# Patient Record
Sex: Female | Born: 1975 | Race: Black or African American | Hispanic: No | Marital: Married | Smoking: Never smoker
Health system: Southern US, Community
[De-identification: ages and names within clinical notes are randomized; demographics above are authoritative.]

## PROBLEM LIST (undated history)

## (undated) HISTORY — PX: BREAST EXCISIONAL BIOPSY: SUR124

---

## 2000-08-24 ENCOUNTER — Other Ambulatory Visit: Admission: RE | Admit: 2000-08-24 | Discharge: 2000-08-24 | Payer: Self-pay | Admitting: *Deleted

## 2001-08-26 ENCOUNTER — Other Ambulatory Visit: Admission: RE | Admit: 2001-08-26 | Discharge: 2001-08-26 | Payer: Self-pay | Admitting: Obstetrics and Gynecology

## 2002-08-28 ENCOUNTER — Other Ambulatory Visit: Admission: RE | Admit: 2002-08-28 | Discharge: 2002-08-28 | Payer: Self-pay | Admitting: Obstetrics and Gynecology

## 2003-09-01 ENCOUNTER — Other Ambulatory Visit: Admission: RE | Admit: 2003-09-01 | Discharge: 2003-09-01 | Payer: Self-pay | Admitting: Obstetrics and Gynecology

## 2004-07-27 ENCOUNTER — Inpatient Hospital Stay (HOSPITAL_COMMUNITY): Admission: AD | Admit: 2004-07-27 | Discharge: 2004-07-30 | Payer: Self-pay | Admitting: Obstetrics and Gynecology

## 2004-07-31 ENCOUNTER — Encounter: Admission: RE | Admit: 2004-07-31 | Discharge: 2004-08-30 | Payer: Self-pay | Admitting: Obstetrics and Gynecology

## 2004-10-10 ENCOUNTER — Encounter: Admission: RE | Admit: 2004-10-10 | Discharge: 2004-10-10 | Payer: Self-pay | Admitting: Obstetrics and Gynecology

## 2005-04-07 ENCOUNTER — Encounter: Admission: RE | Admit: 2005-04-07 | Discharge: 2005-04-07 | Payer: Self-pay | Admitting: Obstetrics and Gynecology

## 2006-03-27 ENCOUNTER — Encounter: Admission: RE | Admit: 2006-03-27 | Discharge: 2006-03-27 | Payer: Self-pay | Admitting: Obstetrics and Gynecology

## 2006-10-01 ENCOUNTER — Ambulatory Visit: Payer: Self-pay | Admitting: Gastroenterology

## 2006-10-19 ENCOUNTER — Ambulatory Visit: Payer: Self-pay | Admitting: Gastroenterology

## 2006-10-29 ENCOUNTER — Ambulatory Visit: Payer: Self-pay | Admitting: Gastroenterology

## 2006-11-28 ENCOUNTER — Encounter: Admission: RE | Admit: 2006-11-28 | Discharge: 2006-11-28 | Payer: Self-pay | Admitting: Obstetrics and Gynecology

## 2007-10-11 ENCOUNTER — Ambulatory Visit: Payer: Self-pay | Admitting: Cardiology

## 2007-10-22 ENCOUNTER — Ambulatory Visit: Payer: Self-pay

## 2007-10-22 ENCOUNTER — Encounter: Payer: Self-pay | Admitting: Cardiology

## 2007-11-06 ENCOUNTER — Ambulatory Visit: Payer: Self-pay | Admitting: Cardiology

## 2008-07-13 ENCOUNTER — Telehealth: Payer: Self-pay | Admitting: Gastroenterology

## 2008-07-13 ENCOUNTER — Ambulatory Visit: Payer: Self-pay | Admitting: Cardiology

## 2008-07-13 ENCOUNTER — Telehealth: Payer: Self-pay | Admitting: Internal Medicine

## 2008-07-13 ENCOUNTER — Ambulatory Visit: Payer: Self-pay | Admitting: Internal Medicine

## 2008-07-13 ENCOUNTER — Encounter: Payer: Self-pay | Admitting: Physician Assistant

## 2008-07-13 DIAGNOSIS — R002 Palpitations: Secondary | ICD-10-CM | POA: Insufficient documentation

## 2008-07-13 DIAGNOSIS — K625 Hemorrhage of anus and rectum: Secondary | ICD-10-CM | POA: Insufficient documentation

## 2008-07-13 DIAGNOSIS — K589 Irritable bowel syndrome without diarrhea: Secondary | ICD-10-CM | POA: Insufficient documentation

## 2008-07-13 DIAGNOSIS — R1031 Right lower quadrant pain: Secondary | ICD-10-CM | POA: Insufficient documentation

## 2008-07-13 DIAGNOSIS — R1032 Left lower quadrant pain: Secondary | ICD-10-CM | POA: Insufficient documentation

## 2008-07-13 DIAGNOSIS — R110 Nausea: Secondary | ICD-10-CM | POA: Insufficient documentation

## 2008-07-13 DIAGNOSIS — R109 Unspecified abdominal pain: Secondary | ICD-10-CM | POA: Insufficient documentation

## 2008-07-15 ENCOUNTER — Telehealth: Payer: Self-pay | Admitting: Gastroenterology

## 2008-07-27 ENCOUNTER — Ambulatory Visit: Payer: Self-pay | Admitting: Gastroenterology

## 2008-07-31 ENCOUNTER — Ambulatory Visit: Payer: Self-pay | Admitting: Gastroenterology

## 2008-07-31 ENCOUNTER — Encounter: Payer: Self-pay | Admitting: Gastroenterology

## 2008-08-04 ENCOUNTER — Encounter: Payer: Self-pay | Admitting: Gastroenterology

## 2008-08-04 ENCOUNTER — Telehealth: Payer: Self-pay | Admitting: Gastroenterology

## 2008-08-17 ENCOUNTER — Ambulatory Visit: Payer: Self-pay | Admitting: Gastroenterology

## 2010-04-05 ENCOUNTER — Ambulatory Visit: Payer: Self-pay | Admitting: Gastroenterology

## 2010-04-05 ENCOUNTER — Telehealth: Payer: Self-pay | Admitting: Gastroenterology

## 2010-04-05 DIAGNOSIS — R1013 Epigastric pain: Secondary | ICD-10-CM

## 2010-04-05 DIAGNOSIS — K3189 Other diseases of stomach and duodenum: Secondary | ICD-10-CM | POA: Insufficient documentation

## 2010-05-09 ENCOUNTER — Encounter: Payer: Self-pay | Admitting: Gastroenterology

## 2010-05-09 ENCOUNTER — Telehealth: Payer: Self-pay | Admitting: Gastroenterology

## 2010-05-10 ENCOUNTER — Telehealth: Payer: Self-pay | Admitting: Gastroenterology

## 2010-06-22 ENCOUNTER — Encounter (INDEPENDENT_AMBULATORY_CARE_PROVIDER_SITE_OTHER): Payer: Self-pay | Admitting: *Deleted

## 2010-06-29 ENCOUNTER — Ambulatory Visit
Admission: RE | Admit: 2010-06-29 | Discharge: 2010-06-29 | Payer: Self-pay | Source: Home / Self Care | Attending: Gastroenterology | Admitting: Gastroenterology

## 2010-07-08 ENCOUNTER — Encounter: Payer: Self-pay | Admitting: Gastroenterology

## 2010-07-08 ENCOUNTER — Ambulatory Visit
Admission: RE | Admit: 2010-07-08 | Discharge: 2010-07-08 | Payer: Self-pay | Source: Home / Self Care | Attending: Gastroenterology | Admitting: Gastroenterology

## 2010-07-10 ENCOUNTER — Encounter: Payer: Self-pay | Admitting: Internal Medicine

## 2010-07-10 ENCOUNTER — Encounter: Payer: Self-pay | Admitting: Obstetrics and Gynecology

## 2010-07-12 ENCOUNTER — Telehealth: Payer: Self-pay | Admitting: Gastroenterology

## 2010-07-19 NOTE — Assessment & Plan Note (Signed)
Summary: indigestion/sheri    History of Present Illness Visit Type: Follow-up Visit Primary GI MD: Melvia Heaps MD [facility] Primary Provider: Rudi Heap, MD Requesting Provider: n/a Chief Complaint: Patient c/o worsening GERD symptoms which has become more noticable in the last 2 weeks. Patient also has some nausea but no vomiting. She has some abdominal bloating as well as periumiblical abdominal pain. History of Present Illness:   Tara [name] returned complaining of nausea, abdominal distention and excess gas.  She has had some pyrosis.  Symptoms have occurred over the past 2 weeks.  She feels bloated and has a mild periumbilical upper abdominal discomfort.  She has had similar episodes of in the past but of  much shorter duration.  There has been no change in medications.  There is no change in her bowel habits.   GI Review of Systems    Reports abdominal pain, acid reflux, belching, bloating, chest pain, heartburn, and  nausea.     Location of  Abdominal pain: periumbilical.    Denies dysphagia with liquids, dysphagia with solids, loss of appetite, vomiting, vomiting blood, weight loss, and  weight gain.        Denies anal fissure, black tarry stools, change in bowel habit, constipation, diarrhea, diverticulosis, fecal incontinence, heme positive stool, hemorrhoids, irritable bowel syndrome, jaundice, light color stool, liver problems, rectal bleeding, and  rectal pain.    Current Medications (verified): 1)  Lutera 0.1-20 Mg-Mcg Tabs (Levonorgestrel-Ethinyl Estrad) .... Take 1 Tablet By Mouth Once A Day 2)  Kids Gummy Bear Vitamins  Chew (Pediatric Multivit-Minerals-C) .... Once Daily  Allergies (verified): 1)  ! * Ivp Dye  Past History:  Past Medical History: Reviewed history from 07/13/2008 and no changes required. Current Problems:  ABDOMINAL PAIN, UPPER (ICD-789.09) Hx of NAUSEA (ICD-787.02) Hx of PALPITATIONS (ICD-785.1)  Past Surgical History: Boipsy on Rt  breast.  Family History: Family History of Heart Disease: Paternal grandparents No FH of Colon Cancer: Family History of Breast Cancer:Aunt  Family History of Ovarian Cancer: Grandmother Family History of Prostate Cancer: Uncle Family History of Colon Polyps: Mother Family History of Diabetes: Grandmother, Mother  Social History: Reviewed history from 08/17/2008 and no changes required. Patient has never smoked.  Alcohol Use - no Occupation: [employer] Daily Caffeine Use -0 Illicit Drug Use - no  Review of Systems       The patient complains of allergy/sinus.  The patient denies anemia, anxiety-new, arthritis/joint pain, back pain, blood in urine, breast changes/lumps, change in vision, confusion, cough, coughing up blood, depression-new, fainting, fatigue, fever, headaches-new, hearing problems, heart murmur, heart rhythm changes, itching, menstrual pain, muscle pains/cramps, night sweats, nosebleeds, pregnancy symptoms, shortness of breath, skin rash, sleeping problems, sore throat, swelling of feet/legs, swollen lymph glands, thirst - excessive , urination - excessive , urination changes/pain, urine leakage, vision changes, and voice change.         All other systems were reviewed and were negative   Vital Signs:  Patient profile:   35 year old female Height:      63 inches Weight:      144 pounds BMI:     25.60 BSA:     1.68 Pulse rate:   88 / minute Pulse rhythm:   regular BP sitting:   114 / 80  (left arm)  Vitals Entered By: Lamona Curl CMA Duncan Dull) (April 05, 2010 1:42 PM)  Physical Exam  Additional Exam:  On physical exam she is a well-developed well-nourished female  skin: anicteric HEENT:  normocephalic; PEERLA; no nasal or pharyngeal abnormalities neck: supple nodes: no cervical lymphadenopathy chest: clear to ausculatation and percussion heart: no murmurs, gallops, or rubs abd: soft, nontender; BS normoactive; no abdominal masses, tenderness,  organomegaly; there is mild abdominal distention rectal: deferred ext: no cynanosis, clubbing, edema skeletal: no deformities neuro: oriented x 3; no focal abnormalities    Impression & Recommendations:  Problem # 1:  DYSPEPSIA (ICD-536.8) Assessment New Symptoms are nonspecific and could be due to ulcer or nonulcer dyspepsia.  Gastroparesis is also a consideration.  Recommendations #1 trial of Nexium 40 mg daily #2 if patient does not improve the next 5-7 days I would consider upper endoscopy and, if not diagnostic, gastric emptying scan  Patient Instructions: 1)  Copy sent to : Rudi Heap, MD 2)  Nexium has bent sent to your pharmacy 3)  The medication list was reviewed and reconciled.  All changed / newly prescribed medications were explained.  A complete medication list was provided to the patient / caregiver. Prescriptions: NEXIUM 40 MG CPDR (ESOMEPRAZOLE MAGNESIUM) take one tab daily  #30 x 1   Entered and Authorized by:   Louis Meckel MD   Signed by:   Louis Meckel MD on 04/05/2010   Method used:   Electronically to        [facility] [facility] (retail)       [address]       [place], [place]  [address]       Ph: [phone]       Fax: [phone]   RxID:   [id number]

## 2010-07-19 NOTE — Progress Notes (Signed)
Summary: triage   Phone Note Call from Patient Call back at [phone]   Caller: Patient Call For: Dr. Arlyce Dice Reason for Call: Talk to Nurse Summary of Call: offered ptnext available for "persistent indigestion"... but pt says she cant wait until November to be seen, she is "sick" Initial call taken by: Vallarie Mare,  April 05, 2010 8:30 AM  Follow-up for Phone Call        patient scheduled for today at 1:30 Follow-up by: Darcey Nora RN, CGRN,  April 05, 2010 9:04 AM

## 2010-07-19 NOTE — Progress Notes (Signed)
Summary: Triage   Phone Note Call from Patient Call back at Home Phone [phone]   Caller: Patient Call For: Dr. Arlyce Dice Reason for Call: Talk to Nurse Summary of Call: Started Nexium 2 wks ago was told to f/u in 1 week. Abd pain last night. Initial call taken by: Karna Christmas,  May 09, 2010 8:13 AM  Follow-up for Phone Call        Patient  had an initial improvement in her GERD symptoms, but last night her abdominal pain returned. Per your office visit 10/18/11you would consider EGD if no improvement.  Please advise next step. Follow-up by: Darcey Nora RN, CGRN,  May 09, 2010 3:50 PM  Additional Follow-up for Phone Call Additional follow up Details #1::        schedule EGD Additional Follow-up by: Louis Meckel MD,  May 09, 2010 4:08 PM    Additional Follow-up for Phone Call Additional follow up Details #2::    Left message for patient to call back Darcey Nora RN, [facility]  May 09, 2010 4:35 PM  Patient  is scheduled for an EGD 07/08/09 4:00, pre-visit  06/24/09 4:00 Follow-up by: Darcey Nora RN, CGRN,  May 09, 2010 5:03 PM

## 2010-07-19 NOTE — Letter (Signed)
Summary: Previsit letter  [facility]  [address] [place], [place] [address]   Phone: [phone]  Fax: [phone]       05/09/2010 MRN: [id number]  Tara [name] [address] [place], [place]  [address]  Dear Ms. [place],  Welcome to the Gastroenterology Division at [facility].    You are scheduled to see a nurse for your pre-procedure visit on 06/24/09  at 4:00 on the 3rd floor at [facility], [address]  We ask that you try to arrive at our office 15 minutes prior to your appointment time to allow for check-in.  Your nurse visit will consist of discussing your medical and surgical history, your immediate family medical history, and your medications.    Please bring a complete list of all your medications or, if you prefer, bring the medication bottles and we will list them.  We will need to be aware of both prescribed and over the counter drugs.  We will need to know exact dosage information as well.  If you are on blood thinners (Coumadin, Plavix, Aggrenox, Ticlid, etc.) please call our office today/prior to your appointment, as we need to consult with your physician about holding your medication.   Please be prepared to read and sign documents such as consent forms, a financial agreement, and acknowledgement forms.  If necessary, and with your consent, a friend or relative is welcome to sit-in on the nurse visit with you.  Please bring your insurance card so that we may make a copy of it.  If your insurance requires a referral to see a specialist, please bring your referral form from your primary care physician.  No co-pay is required for this nurse visit.     If you cannot keep your appointment, please call [phone] to cancel or reschedule prior to your appointment date.  This allows [place] the opportunity to schedule an appointment for another patient in need of care.    Thank you for choosing [facility] for your medical needs.   We appreciate the opportunity to care for you.  Please visit [place] at our website  to learn more about our practice.                     Sincerely.                                                                                                                   The Gastroenterology Division

## 2010-07-19 NOTE — Progress Notes (Signed)
Summary: Nexium ?'s   Phone Note Call from Patient Call back at Home Phone [phone]   Caller: Patient Call For: Dr. Arlyce Dice Reason for Call: Talk to Nurse Summary of Call: Nexium ?'s Initial call taken by: Vallarie Mare,  April 05, 2010 3:45 PM  Follow-up for Phone Call        Returned pts call, she wanted to verify how to take the Nexium 1 hour or 30 minutes before first meal of th day..Explained to her to take 30 minutes before first meal of the day Follow-up by: Merri Ray CMA Duncan Dull),  April 05, 2010 4:29 PM

## 2010-07-19 NOTE — Progress Notes (Signed)
Summary: ?'s re Nexium   Phone Note Call from Patient Call back at Home Phone [phone]   Caller: Patient Call For: Dr. Arlyce Dice Reason for Call: Talk to Nurse Summary of Call: ?'s re Nexium Initial call taken by: Vallarie Mare,  May 10, 2010 1:21 PM  Follow-up for Phone Call        patient asking if she should stay on Nexium.  Advised she needs to take Nexium daily Follow-up by: Darcey Nora RN, CGRN,  May 10, 2010 1:32 PM

## 2010-07-21 NOTE — Miscellaneous (Signed)
Summary: [facility]  Clinical Lists Changes  Allergies: Changed allergy or adverse reaction from * IVP DYE to * IVP DYE

## 2010-07-21 NOTE — Miscellaneous (Signed)
  Clinical Lists Changes  Medications: Removed medication of NEXIUM 40 MG CPDR (ESOMEPRAZOLE MAGNESIUM) take one tab daily Added new medication of HYOMAX-SL 0.125 MG SUBL (HYOSCYAMINE SULFATE) take 2 tabs s.l. q4h as needed abd pain - Signed Rx of HYOMAX-SL 0.125 MG SUBL (HYOSCYAMINE SULFATE) take 2 tabs s.l. q4h as needed abd pain;  #20 x 2;  Signed;  Entered by: Louis Meckel MD;  Authorized by: Louis Meckel MD;  Method used: Electronically to [facility] [address], [place], [place], [place]  [address], Ph: [phone], Fax: [phone]    Prescriptions: HYOMAX-SL 0.125 MG SUBL (HYOSCYAMINE SULFATE) take 2 tabs s.l. q4h as needed abd pain  #20 x 2   Entered and Authorized by:   Louis Meckel MD   Signed by:   Louis Meckel MD on 07/08/2010   Method used:   Electronically to        [facility] [address] [facility] (retail)       [address]       [place], [place]  [address]       Ph: [phone]       Fax: [phone]   RxID:   [id number]

## 2010-07-21 NOTE — Procedures (Addendum)
Summary: Upper Endoscopy  Patient: Tara [name] Note: All result statuses are Final unless otherwise noted.  Tests: (1) Upper Endoscopy (EGD)   EGD Upper Endoscopy       DONE (C)      [facility]     [address]     [place], [place]  [address]           ENDOSCOPY PROCEDURE REPORT           PATIENT:  Tara, [name]  MR#:  [id number]     BIRTHDATE:  1976-01-30, 34 yrs. old  GENDER:  female           ENDOSCOPIST:  Barbette Hair. Arlyce Dice, MD     Referred by:  Rudi Heap, M.D.           PROCEDURE DATE:  07/08/2010     PROCEDURE:  EGD - diagnostic (correction)     ASA CLASS:  Class I     INDICATIONS:  abdominal pain           MEDICATIONS:   Fentanyl 75 mcg IV, Versed 7 mg IV, glycopyrrolate     (Robinal) 0.2 mg IV, 0.6cc simethancone 0.6 cc PO     TOPICAL ANESTHETIC:  Exactacain Spray           DESCRIPTION OF PROCEDURE:   After the risks benefits and     alternatives of the procedure were thoroughly explained, informed     consent was obtained.  The LB GIF-H180 [id number] endoscope was     introduced through the mouth and advanced to the third portion of     the duodenum, without limitations.  The instrument was slowly     withdrawn as the mucosa was fully examined.     <<PROCEDUREIMAGES>>           The upper, middle, and distal third of the esophagus were     carefully inspected and no abnormalities were noted. The z-line     was well seen at the GEJ. The endoscope was pushed into the fundus     which was normal including a retroflexed view. The antrum,gastric     body, first and second part of the duodenum were unremarkable (see     image1, image2, image3, image4, image5, and image7).     Retroflexed views revealed no abnormalities.    The scope was then     withdrawn from the patient and the procedure completed.           COMPLICATIONS:  None           ENDOSCOPIC IMPRESSION:     1) Normal EGD     RECOMMENDATIONS: 1) continue nexium (correction)     2) hyomax  as needed     3) return to office as     needed           REPEAT EXAM:  No           ______________________________     Barbette Hair. Arlyce Dice, MD           CC:           n.     REVISED:  07/11/2010 08:49 AM     eSIGNED:   Barbette Hair. Kaplan at 07/11/2010 08:49 AM           Jorje Guild, [id number]  Note: An exclamation mark (!) indicates a result that was not dispersed into the flowsheet. Document Creation Date: 07/11/2010 8:49 AM _______________________________________________________________________  Marland Kitchen  1) Order result status: Final Collection or observation date-time: 07/08/2010 16:06 Requested date-time:  Receipt date-time:  Reported date-time:  Referring Physician:   Ordering Physician: Melvia Heaps [phone]) Specimen Source:  Source: Launa Grill Order Number: [id number] Lab site:

## 2010-07-21 NOTE — Letter (Signed)
Summary: EGD Instructions  [facility]  [address]   [place], [place] [address]   Phone: [phone]  Fax: [phone]       Tara [name]    [birth year]    MRN: [id number]       Procedure Day [name]: Friday, 07-08-10     Arrival Time: 3:00 p.m.     Procedure Time: 4:00 p.m.     Location of Procedure:                    x  [facility] (4th Floor)  PREPARATION FOR ENDOSCOPY  On 07-08-10  THE DAY OF THE PROCEDURE:  1.   No solid foods, milk or milk products are allowed after midnight the night before your procedure.  2.   Do not drink anything colored red or purple.  Avoid juices with pulp.  No orange juice.  3.  You may drink clear liquids until 2:00 p.m., which is 2 hours before your procedure.                                                                                                CLEAR LIQUIDS INCLUDE: Water Jello Ice Popsicles Tea (sugar ok, no milk/cream) Powdered fruit flavored drinks Coffee (sugar ok, no milk/cream) Gatorade Juice: apple, white grape, white cranberry  Lemonade Clear bullion, consomm, broth Carbonated beverages (any kind) Strained chicken noodle soup Hard Candy   MEDICATION INSTRUCTIONS  Unless otherwise instructed, you should take regular prescription medications with a small sip of water as early as possible the morning of your procedure.               OTHER INSTRUCTIONS  You will need a responsible adult at least 37 years of age to accompany you and drive you home.   This person must remain in the waiting room during your procedure.  Wear loose fitting clothing that is easily removed.  Leave jewelry and other valuables at home.  However, you may wish to bring a book to read or an iPod/MP3 player to listen to music as you wait for your procedure to start.  Remove all body piercing jewelry and leave at home.  Total time from sign-in until discharge is approximately 2-3 hours.  You should go  home directly after your procedure and rest.  You can resume normal activities the day after your procedure.  The day of your procedure you should not:   Drive   Make legal decisions   Operate machinery   Drink alcohol   Return to work  You will receive specific instructions about eating, activities and medications before you leave.    The above instructions have been reviewed and explained to me by   Ezra Sites RN  June 29, 2010 4:15 PM    I fully understand and can verbalize these instructions _____________________________ Date _________

## 2010-07-21 NOTE — Progress Notes (Signed)
Summary: Triage   Phone Note Call from Patient Call back at Home Phone [phone]   Caller: Patient Call For: Dr. Arlyce Dice Reason for Call: Talk to Nurse Summary of Call: Patient is having right sided pain and does not feel really well, she says she feels cold but does not have a fever Initial call taken by: [place] Johnson,  July 12, 2010 11:14 AM  Follow-up for Phone Call        Patient states that she had an egd on Friday and now she is having some pain a couple of inches to the right of her belly button. She states that the area is tender to touch and that it hurts when she walks and when she takes a deep breath. Has some pain in her back also. She states she has an overall aches and pains and has some chills. States she does not have a fever. Dr. Arlyce Dice please advise. Follow-up by: Selinda Michaels RN,  July 12, 2010 1:24 PM  Additional Follow-up for Phone Call Additional follow up Details #1::        Have her take hyomax 2 tabs sublingual every 4 hours as gone.  in 24-48 hours if she is not better   She should call back should call back in 24-48 hours the pain is no  better. Additional Follow-up by: Louis Meckel MD,  July 12, 2010 3:42 PM    Additional Follow-up for Phone Call Additional follow up Details #2::    Patient aware of Dr. Marzetta Board recommendations. She will call us back in 24-48hours if no better. Follow-up by: Selinda Michaels RN,  July 12, 2010 3:48 PM

## 2010-11-01 NOTE — Assessment & Plan Note (Signed)
[facility]                            CARDIOLOGY OFFICE NOTE   BRANDILEE, PIES                  MRN:          [id number]  DATE:10/11/2007                            DOB:          [birth year]    PRIMARY:  Paulita Cradle.   REASON FOR PRESENTATION:  Evaluate the patient with palpitations.   HISTORY OF PRESENT ILLNESS:  The patient is pleasant 35 year old African-  American female with a history of palpitations.  She had these initially  when she was pregnant about 3 years ago.  She saw another cardiologist  in [place].  It was felt to be related to the pregnancy.  She had it  again last year.  She was having rapid rates at rest.  This was a sinus  tachycardia.  She reports having a treadmill, which was normal, an echo  which was normal, and a 30-day event monitor.  She was initially given  some cardiac medications that she did not have filled.  She was told she  had generalized anxiety disorder.  She said things slowly improved and  she did not receive treatment.   However, this year she has continued to have palpitations with more  frequency.  This really started in February.  She describes what was  similar to PVCs documented in the past.  She feels a skipped heartbeat.  She may have several of these in an hour.  They may last for hours at a  time.  Some days are better than others.  She notices them often when  she eats.  She otherwise does not notice any association with exercise.  She has had some increased symptoms recently at night.  She will get  mildly dyspneic at times with these.  She will feel a little  lightheaded.  She does not have any chest discomfort, neck or arm  discomfort.  She does not drink caffeine.  She has not had any frank  syncope.  She not had any symptoms of sustained tachy arrhythmias or  what sounds like bigeminy or other pattern.   She did recently start Lexapro 5 mg daily and thinks this helped with  some symptoms.  She does describe generalized tingling in her hands and  legs.  She feels sometimes like her muscles are twitching.   PAST MEDICAL HISTORY:  Premature ventricular contractions documented,  sinus tachycardia.  She has no history of hypertension, diabetes, or  hyperlipidemia.  Past surgical history of right breast biopsy in 2008.   ALLERGIES:  Seasonal.   MEDICATIONS:  Lexapro 5 mg daily, Lutera birth control pill.   SOCIAL HISTORY:  The patient is a Radio broadcast assistant.  She is  married.  She has one 53-year-old child.  She does not smoke cigarettes  or drink alcohol.   FAMILY HISTORY:  Noncontributory for sudden cardiac death,  cardiomyopathy, syncope, arrhythmia, early coronary artery disease, or  other cardiovascular problems.   REVIEW OF SYSTEMS:  As stated in HPI and positive for occasional  headaches, occasional reflux.  Negative for other systems.   PHYSICAL EXAMINATION:  The patient is in no distress.  Blood pressure 118/83, heart rate 108 and regular, weight 132 pounds,  body mass index 23.  HEENT:  Eyelids unremarkable.  Pupils are equal, round, and reactive to  light and accommodation.  Fundi within normal limits.  Oral mucosa  unremarkable.  NECK:  No jugular venous distension at 45 degrees, carotid upstroke  brisk and symmetric, no bruits, thyromegaly.  LYMPHATICS:  No cervical, axillary, or inguinal adenopathy.  LUNGS:  Clear to auscultation bilaterally.  BACK:  No costovertebral angle tenderness.  CHEST:  Unremarkable.  HEART:  PMI not displaced or sustained, S1 and S2 within normal limits,  no S3, no S4, no clicks, rubs, murmurs.  ABDOMEN:  Flat, positive bowel sounds, normal in frequency and pitch, no  bruits, rebound, guarding.  No midline pulsatile mass, hepatomegaly,  splenomegaly.  SKIN:  No rashes, no nodules.  EXTREMITIES:  With 2+ pulses throughout, no edema, cyanosis, clubbing.  NEURO:  Oriented to person, place, and time, cranial  nerves 2-12 grossly  intact, motor grossly intact.    EKG sinus rhythm, rate 102, axis within normal limits, intervals within  normal limits, no acute ST-T wave changes.   ASSESSMENT/PLAN:  1. Palpitations.  The patient had documented premature ventricular      contractions.  I think she is feeling these.  May be premature      atrial contractions.  She had extensive workup.  Given the      increased frequency, I do want to repeat an echocardiogram in      particular looking for RV dysplasia.  If this is normal, I would      not suspect further cardiovascular testing would be warranted.      Will begin to treat these symptomatically.  I will start with      propranolol 10 mg b.i.d.  If she has any problems taking this      medication, she will let me know.  Otherwise, I will follow her in      a couple of weeks.  2. Generalized anxiety disorder.  The patient was diagnosed with this      in the past.  I do suspect that there is some component of this.      She is somewhat anxious in the office.  She was to have started on      Lexapro and will have this followed by Paulita Cradle.  3. Followup.  I will see her in 2 weeks as described.     Rollene Rotunda, MD, [facility]  Electronically Signed    JH/MedQ  DD: 10/11/2007  DT: 10/11/2007  Job #: [id number]   cc:   Paulita Cradle, NP

## 2010-11-01 NOTE — Assessment & Plan Note (Signed)
[facility]                            CARDIOLOGY OFFICE NOTE   Tara, [name]                  MRN:          [id number]  DATE:11/06/2007                            DOB:          15-Jan-1976    PRIMARY CARE PHYSICIAN:  Tara Cradle, MD.   REASON FOR PRESENTATION:  Evaluate patient with palpitations.   HISTORY OF PRESENT ILLNESS:  The patient is a 35 year old with a history  of palpitations as extensively outlined on the previous note.  At that  time, I gave her a prescription for p.r.n. propranolol.  We also did an  echocardiogram to make sure she had no structural abnormalities.  There  was no evidence of RV dysplasia or other arrhythmias.  Of note, the  patient had just been started on Lexapro at the time of that visit.  She  now has been on that medicine for a few weeks and says that with this,  the palpitations have diminished substantially.  She only had 1 day when  she had these when she was stressed for a church program.  She actually  did not have the propranolol at that time.  She has since gotten the  prescription filled, but she not needed to take any.  There has been no  presyncope or syncope.  She has no chest pain.   ASSESSMENT/PLAN:  Palpitations.  I discussed this today with the  patient.  No further cardiovascular testing is suggested.  She will  continue on Lexapro and will take propranolol as needed.  She will come  back and see me if she has any worsening symptoms going forward.     Rollene Rotunda, MD, [facility]  Electronically Signed    JH/MedQ  DD: 11/06/2007  DT: 11/06/2007  Job #: [id number]

## 2010-11-01 NOTE — Assessment & Plan Note (Signed)
[place] [facility]                         GASTROENTEROLOGY OFFICE NOTE   Tara [name], Tara [name]                  MRN:          [id number]  DATE:10/29/2006                            DOB:          1975-06-29    PROBLEM:  Abdominal pain and nausea.   [name] has returned for scheduled followup.  Since her last visit  almost 1 month ago, she has had no further episodes of pain or nausea.  Abdominal ultrasound was entirely unremarkable.  She was given Nexium,  though she did not fill her prescription.  She remains only on an oral  contraceptive.   PHYSICAL EXAMINATION:  Pulse 68, blood pressure 118/80.  Weight 137.   IMPRESSION:  Very intermittent episodes of upper gastrointestinal  distress characterized by pain, nausea and bloating.  Symptoms are  suggestive of acute cholecystitis, though no gallstones were seen by  ultrasound.  This mitigates against, but does not rule out cholecystitis  as a possibility.  She may have ulcer or non-ulcer dyspepsia.   RECOMMENDATIONS:  1. NuLev 0.25 mg sublingual q.4 h. p.r.n. with the onset of pain.  2. If anticholinergics do not promptly help her symptoms, she was      instructed to contact the office, at which point I would attempt to      get a STAT hepatobiliary scan.     Barbette Hair. Arlyce Dice, MD,FACG  Electronically Signed    RDK/MedQ  DD: 10/29/2006  DT: 10/30/2006  Job #: [id number]   cc:   Ernestina Penna, M.D.

## 2010-11-04 NOTE — Letter (Signed)
October 01, 2006    Yolande Jolly   RE:  BRITTLYN, CLOE  MRN:  [id number]  /  DOB:  12-11-1975   Dear Ms. Juanetta Gosling:   It is my pleasure to have treated you recently as a new patient in my  office.  I appreciate your confidence and the opportunity to participate  in your care.   Since I do have a busy inpatient endoscopy schedule and office schedule,  my office hours vary weekly.  I am, however, available for emergency  calls every day through my office.  If I cannot promptly meet an urgent  office appointment, another one of our gastroenterologists will be able  to assist you.   My well-trained staff are prepared to help you at all times.  For  emergencies after office hours, a physician from our gastroenterology  section is always available through my 24-hour answering service.   While you are under my care, I encourage discussion of your questions  and concerns, and I will be happy to return your calls as soon as I am  available.   Once again, I welcome you as a new patient and I look forward to a happy  and healthy relationship.    Sincerely,      Barbette Hair. Arlyce Dice, MD,FACG  Electronically Signed   RDK/MedQ  DD: 10/01/2006  DT: 10/02/2006  Job #: [id number]

## 2010-11-04 NOTE — Assessment & Plan Note (Signed)
[facility]                         GASTROENTEROLOGY OFFICE NOTE   RIDA, LOUDIN                  MRN:          [id number]  DATE:10/01/2006                            DOB:          [birth year]    PROBLEM:  Abdominal pain.   Ms. Frankenfield is a pleasant 35 year old white female referred through the  courtesy of Dr. Christell Constant and his associates for evaluation.  Over the past  2 years she has been suffering from episodic upper abdominal pain.  This  may occur once every 2-3 months and is characterized by moderately  severe midepigastric discomfort.  Pain may radiate to the right side and  around to the back.  It is often associated with nausea and excess  eructations and pyrosis.  She also complains of abdominal bloating when  this occurs.  In between she feels well except for frequent intermittent  nausea.  She has been on oral contraceptives for over 2 years.  She is  on no gastric irritants, including nonsteroidals.  There has been no  change in bowel habits.  Bowels tend to be somewhat erratic.  There is  no history of melena or hematochezia.   PAST MEDICAL HISTORY:  Unremarkable.   FAMILY HISTORY:  Noncontributory.   Only medications are oral contraceptives.   She has no allergies.   She neither smokes nor drinks.  She is married and works as a Nurse, learning disability.   REVIEW OF SYSTEMS:  Positive for back pain, headaches and fatigue.   PHYSICAL EXAMINATION:  VITAL SIGNS:  Pulse 88, blood pressure 130/88,  weight 132.  HEENT: EOMI. PERRLA. Sclerae are anicteric.  Conjunctivae are pink.  NECK:  Supple without thyromegaly, adenopathy or carotid bruits.  CHEST:  Clear to auscultation and percussion without adventitious  sounds.  CARDIAC:  Regular rhythm; normal S1 S2.  There are no murmurs, gallops  or rubs.  ABDOMEN:  Bowel sounds are normoactive.  Abdomen is soft, non-tender and  non-distended.  There are no abdominal masses,  tenderness, splenic  enlargement or hepatomegaly.  EXTREMITIES:  Full range of motion.  No cyanosis, clubbing or edema.  RECTAL:  Deferred.   IMPRESSION:  Intermittent moderately severe abdominal pain with bloating  and nausea.  Symptoms could be due to acute cholecystitis.  Ulcer or  nonulcer dyspepsia and gastroesophageal reflux disease are other  considerations.  Finally, her oral contraceptive could be causing  nausea.   RECOMMENDATION:  1. A trial of Nexium 40 mg a day.  2. Abdominal ultrasound.  3. Consider upper endoscopy if symptoms continue and ultrasound is      negative.  4. Consider holding her oral contraceptive if nausea continues and no      diagnosis is evident by the above tests.     Barbette Hair. Arlyce Dice, MD,FACG  Electronically Signed    RDK/MedQ  DD: 10/01/2006  DT: 10/02/2006  Job #: [id number]   cc:   Ernestina Penna, M.D.

## 2010-11-04 NOTE — Letter (Signed)
October 01, 2006    Ernestina Penna, M.D.  [address] [place], [place] [address]   RE:  Tara [name], Tara [name]  MRN:  [id number]  /  DOB:  01-Apr-1976   Dear Dr. Christell Constant:   Upon your kind referral, I had the pleasure of evaluating your patient  and I am pleased to offer my findings.  I saw Tara [name] in  the office today.  Enclosed is a copy of my progress note that details  my findings and recommendations.   Thank you for the opportunity to participate in your patient's care.    Sincerely,      Barbette Hair. Arlyce Dice, MD,FACG  Electronically Signed    RDK/MedQ  DD: 10/01/2006  DT: 10/02/2006  Job #: [id number]

## 2010-12-08 ENCOUNTER — Other Ambulatory Visit: Payer: Self-pay | Admitting: Obstetrics and Gynecology

## 2012-02-26 ENCOUNTER — Other Ambulatory Visit: Payer: Self-pay | Admitting: Obstetrics and Gynecology

## 2012-02-26 DIAGNOSIS — N644 Mastodynia: Secondary | ICD-10-CM

## 2012-02-26 DIAGNOSIS — Z9889 Other specified postprocedural states: Secondary | ICD-10-CM

## 2012-02-28 ENCOUNTER — Ambulatory Visit
Admission: RE | Admit: 2012-02-28 | Discharge: 2012-02-28 | Disposition: A | Discharge status: Home / Self Care | Payer: BC Managed Care – PPO | Source: Ambulatory Visit | Attending: Obstetrics and Gynecology | Admitting: Obstetrics and Gynecology

## 2012-02-28 DIAGNOSIS — Z9889 Other specified postprocedural states: Secondary | ICD-10-CM

## 2012-02-28 DIAGNOSIS — N644 Mastodynia: Secondary | ICD-10-CM

## 2013-08-08 ENCOUNTER — Encounter (INDEPENDENT_AMBULATORY_CARE_PROVIDER_SITE_OTHER): Payer: Self-pay

## 2013-08-08 ENCOUNTER — Ambulatory Visit (INDEPENDENT_AMBULATORY_CARE_PROVIDER_SITE_OTHER): Payer: BC Managed Care – PPO | Admitting: Nurse Practitioner

## 2013-08-08 ENCOUNTER — Encounter: Payer: Self-pay | Admitting: Nurse Practitioner

## 2013-08-08 VITALS — BP 118/74 | HR 99 | Temp 99.8°F | Ht 63.0 in | Wt 143.0 lb

## 2013-08-08 DIAGNOSIS — K130 Diseases of lips: Secondary | ICD-10-CM

## 2013-08-08 DIAGNOSIS — R600 Localized edema: Secondary | ICD-10-CM

## 2013-08-08 DIAGNOSIS — J329 Chronic sinusitis, unspecified: Secondary | ICD-10-CM

## 2013-08-08 DIAGNOSIS — L500 Allergic urticaria: Secondary | ICD-10-CM

## 2013-08-08 MED ORDER — METHYLPREDNISOLONE ACETATE 80 MG/ML IJ SUSP
80.0000 mg | Freq: Once | INTRAMUSCULAR | Status: AC
  Administered 2013-08-08: 80 mg via INTRAMUSCULAR

## 2013-08-08 NOTE — Patient Instructions (Addendum)
Hives Hives are itchy, red, swollen areas of the skin. They can vary in size and location on your body. Hives can come and go for hours or several days (acute hives) or for several weeks (chronic hives). Hives do not spread from person to person (noncontagious). They may get worse with scratching, exercise, and emotional stress. CAUSES   Allergic reaction to food, additives, or drugs.  Infections, including the common cold.  Illness, such as vasculitis, lupus, or thyroid disease.  Exposure to sunlight, heat, or cold.  Exercise.  Stress.  Contact with chemicals. SYMPTOMS   Red or white swollen patches on the skin. The patches may change size, shape, and location quickly and repeatedly.  Itching.  Swelling of the hands, feet, and face. This may occur if hives develop deeper in the skin. DIAGNOSIS  Your caregiver can usually tell what is wrong by performing a physical exam. Skin or blood tests may also be done to determine the cause of your hives. In some cases, the cause cannot be determined. TREATMENT  Mild cases usually get better with medicines such as antihistamines. Severe cases may require an emergency epinephrine injection. If the cause of your hives is known, treatment includes avoiding that trigger.  HOME CARE INSTRUCTIONS   Avoid causes that trigger your hives.  Take antihistamines as directed by your caregiver to reduce the severity of your hives. Non-sedating or low-sedating antihistamines are usually recommended. Do not drive while taking an antihistamine.  Take any other medicines prescribed for itching as directed by your caregiver.  Wear loose-fitting clothing.  Keep all follow-up appointments as directed by your caregiver. SEEK MEDICAL CARE IF:   You have persistent or severe itching that is not relieved with medicine.  You have painful or swollen joints. SEEK IMMEDIATE MEDICAL CARE IF:   You have a fever.  Your tongue or lips are swollen.  You have  trouble breathing or swallowing.  You feel tightness in the throat or chest.  You have abdominal pain. These problems may be the first sign of a life-threatening allergic reaction. Call your local emergency services (911 in [place] MAKE SURE YOU:   Understand these instructions.  Will watch your condition.  Will get help right away if you are not doing well or get worse. Document Released: 06/05/2005 Document Revised: 12/05/2011 Document Reviewed: 08/29/2011 [facility] Patient Information 2014 [employer], [place].   1. Take meds as prescribed 2. Use a cool mist humidifier especially during the winter months and when heat has been humid. 3. Use saline nose sprays frequently 4. Saline irrigations of the nose can be very helpful if done frequently.  * 4X daily for 1 week*  * Use of a nettie pot can be helpful with this. Follow directions with this* 5. Drink plenty of fluids 6. Keep thermostat turn down low 7.For any cough or congestion  Use plain Mucinex- regular strength or max strength is fine   * Children- consult with Pharmacist for dosing 8. For fever or aces or pains- take tylenol or ibuprofen appropriate for age and weight.  * for fevers greater than 101 orally you may alternate ibuprofen and tylenol every  3 hours.

## 2013-08-08 NOTE — Progress Notes (Signed)
   Subjective:    Patient ID: Tara [name], female    DOB: 01-08-1976, 38 y.o.   MRN: [id number]  HPI Patiient in c/o sinus headache that started over the weekend- some better. But she started breaking out in hives Monday and has continued- This morning she woke up with lip swelling.    Review of Systems  Constitutional: Negative for fever and chills.  HENT: Positive for congestion.   Respiratory: Negative.   Cardiovascular: Negative.   Skin: Positive for rash.  All other systems reviewed and are negative.       Objective:   Physical Exam  Constitutional: She appears well-developed and well-nourished.  HENT:  Right Ear: Hearing, tympanic membrane, external ear and ear canal normal.  Left Ear: Hearing, tympanic membrane, external ear and ear canal normal.  Nose: Mucosal edema and rhinorrhea present. Right sinus exhibits no maxillary sinus tenderness and no frontal sinus tenderness. Left sinus exhibits no maxillary sinus tenderness and no frontal sinus tenderness.  Mouth/Throat: Uvula is midline, oropharynx is clear and moist and mucous membranes are normal.  Lip swollen  Neck: Normal range of motion. Neck supple.  Cardiovascular: Normal rate, regular rhythm and normal heart sounds.   Pulmonary/Chest: Effort normal and breath sounds normal.  Abdominal: Soft. Bowel sounds are normal.  Lymphadenopathy:    She has no cervical adenopathy.  Skin: Skin is warm.  Urticaria scattered  Psychiatric: She has a normal mood and affect. Her behavior is normal. Judgment and thought content normal.   BP 118/74  Pulse 99  Temp(Src) 99.8 F (37.7 C) (Oral)  Ht 5\' 3"  (1.6 m)  Wt 143 lb (64.864 kg)  BMI 25.34 kg/m2        Assessment & Plan:   1. Allergic urticaria   2. Lip edema   3. Sinusitis    Meds ordered this encounter  Medications  . methylPREDNISolone acetate (DEPO-MEDROL) injection 80 mg    Sig:    1. Take meds as prescribed 2. Use a cool mist humidifier  especially during the winter months and when heat has been humid. 3. Use saline nose sprays frequently 4. Saline irrigations of the nose can be very helpful if done frequently.  * 4X daily for 1 week*  * Use of a nettie pot can be helpful with this. Follow directions with this* 5. Drink plenty of fluids 6. Keep thermostat turn down low 7.For any cough or congestion  Use plain Mucinex- regular strength or max strength is fine   * Children- consult with Pharmacist for dosing 8. For fever or aces or pains- take tylenol or ibuprofen appropriate for age and weight.  * for fevers greater than 101 orally you may alternate ibuprofen and tylenol every  3 hours.   Mary-Margaret Hassell Done, FNP

## 2014-01-09 ENCOUNTER — Other Ambulatory Visit: Payer: Self-pay

## 2014-01-09 DIAGNOSIS — Z1231 Encounter for screening mammogram for malignant neoplasm of breast: Secondary | ICD-10-CM

## 2014-01-12 ENCOUNTER — Ambulatory Visit
Admission: RE | Admit: 2014-01-12 | Discharge: 2014-01-12 | Disposition: A | Discharge status: Home / Self Care | Payer: BC Managed Care – PPO | Source: Ambulatory Visit

## 2014-01-12 DIAGNOSIS — Z1231 Encounter for screening mammogram for malignant neoplasm of breast: Secondary | ICD-10-CM

## 2014-01-13 ENCOUNTER — Other Ambulatory Visit: Payer: Self-pay | Admitting: Obstetrics and Gynecology

## 2014-01-13 DIAGNOSIS — R928 Other abnormal and inconclusive findings on diagnostic imaging of breast: Secondary | ICD-10-CM

## 2014-01-22 ENCOUNTER — Ambulatory Visit
Admission: RE | Admit: 2014-01-22 | Discharge: 2014-01-22 | Disposition: A | Discharge status: Home / Self Care | Payer: BC Managed Care – PPO | Source: Ambulatory Visit | Attending: Obstetrics and Gynecology | Admitting: Obstetrics and Gynecology

## 2014-01-22 ENCOUNTER — Encounter (INDEPENDENT_AMBULATORY_CARE_PROVIDER_SITE_OTHER): Payer: Self-pay

## 2014-01-22 DIAGNOSIS — R928 Other abnormal and inconclusive findings on diagnostic imaging of breast: Secondary | ICD-10-CM

## 2014-06-24 ENCOUNTER — Other Ambulatory Visit: Payer: Self-pay | Admitting: Obstetrics and Gynecology

## 2014-06-24 DIAGNOSIS — D241 Benign neoplasm of right breast: Secondary | ICD-10-CM

## 2014-06-24 DIAGNOSIS — N6310 Unspecified lump in the right breast, unspecified quadrant: Secondary | ICD-10-CM

## 2014-08-07 ENCOUNTER — Ambulatory Visit
Admission: RE | Admit: 2014-08-07 | Discharge: 2014-08-07 | Disposition: A | Discharge status: Home / Self Care | Payer: BLUE CROSS/BLUE SHIELD | Source: Ambulatory Visit | Attending: Obstetrics and Gynecology | Admitting: Obstetrics and Gynecology

## 2014-08-07 DIAGNOSIS — N6310 Unspecified lump in the right breast, unspecified quadrant: Secondary | ICD-10-CM

## 2014-08-07 DIAGNOSIS — D241 Benign neoplasm of right breast: Secondary | ICD-10-CM

## 2014-08-26 ENCOUNTER — Ambulatory Visit: Payer: BLUE CROSS/BLUE SHIELD | Attending: Physician Assistant | Admitting: Physical Therapy

## 2014-08-26 DIAGNOSIS — M545 Low back pain: Secondary | ICD-10-CM | POA: Diagnosis not present

## 2014-08-26 DIAGNOSIS — M533 Sacrococcygeal disorders, not elsewhere classified: Secondary | ICD-10-CM | POA: Diagnosis not present

## 2014-08-26 NOTE — Patient Instructions (Signed)
Instructed patient in sleeping posture, adductor squeeze and hip bridges.

## 2014-08-26 NOTE — Therapy (Signed)
[facility] [place], [place], [address] Phone: [phone]   Fax:  [phone]  Physical Therapy Evaluation  Patient Details  Name: MARESSA APOLLO MRN: [id number] Date of Birth: 03-14-1976 Referring Provider:  Jeri Cos, MD  Encounter Date: 08/26/2014      PT End of Session - 08/26/14 1302    Visit Number 1   Number of Visits 12   Date for PT Re-Evaluation 10/21/14   PT Start Time 0102   PT Stop Time 0138   PT Time Calculation (min) 36 min      No past medical history on file.  No past surgical history on file.  There were no vitals taken for this visit.  Visit Diagnosis:  Right low back pain, with sciatica presence unspecified - Plan: PT plan of care cert/re-cert  Sacral back pain - Plan: PT plan of care cert/re-cert      Subjective Assessment - 08/26/14 1333    Symptoms Back feels pretty good today.   Pertinent History Incident like this while pregnant.   Limitations Sitting   How long can you sit comfortably? 30 minutes.   Patient Stated Goals Get out of pain and workout.   Currently in Pain? Yes   Pain Score 2    Pain Location Back   Pain Orientation Right;Left;Mid   Pain Descriptors / Indicators Aching   Pain Type Chronic pain   Pain Radiating Towards Right.   Pain Onset More than a month ago   Aggravating Factors  Sittng.   Pain Relieving Factors Walking.   Effect of Pain on Daily Activities Impairs ability to workout like she would desire.   Multiple Pain Sites No          [facility] PT Assessment - 08/26/14 0001    Assessment   Medical Diagnosis Bilateral LBP with right sided scitica; Lumbar strain.   Onset Date --  Mid-January 2016.   Precautions   Precautions None   Balance Screen   Has the patient fallen in the past 6 months Yes   How many times? 1   Has the patient had a decrease in activity level because of a fear of falling?  No   Is the patient reluctant to leave their home  because of a fear of falling?  No   ROM / Strength   AROM / PROM / Strength AROM;Strength   AROM   Overall AROM Comments Normal active spinal movement.   Strength   Overall Strength Comments Normal LE strength   Palpation   Palpation --  Mild tenderness over right SIJ.   Special Tests    Special Tests Lumbar;Sacrolliac Tests;Leg LengthTest   Sacroiliac Tests  --  Negative FABER and SLR.  Normal LE DTR's.   Leg length test  --  Equal.                          PT Education - 08/26/14 1343    Education provided Yes   Person(s) Educated Patient   Methods Explanation;Demonstration   Comprehension Verbalized understanding;Returned demonstration          PT Short Term Goals - 08/26/14 1351    PT SHORT TERM GOAL #1   Title Independent with advanced HEP   Time 4   Period Weeks   Status New                  Plan - 08/26/14 1344  Clinical Impression Statement The patient reports after sledding with her daughter in Mid-January (2016).  The patient reports that pain was very intense and she walked in a flexed position.  However, over time the pain has subsided and today she reports a pain not > 210 today..  The atient feels she is doing very well and likely will not need many visits.  Recommended she return in 2 weeks for advanced spinal strengthening exercises.  She plans to return to the gym and states if she is feeling good she will cancel her scheduled visit with [place].  Informed her, should she have a flare-up she can call the clinic and get in.   Rehab Potential Excellent   PT Frequency 2x / week   PT Duration 4 weeks   PT Next Visit Plan Core exercises, prone over pillow chest and LE lifts.  Quadriped exercises.   Consulted and Agree with Plan of Care Patient         Problem List Patient Active Problem List   Diagnosis Date Noted  . DYSPEPSIA 04/05/2010  . IRRITABLE BOWEL SYNDROME 07/13/2008  . RECTAL BLEEDING 07/13/2008  . PALPITATIONS  07/13/2008  . NAUSEA 07/13/2008  . RLQ PAIN 07/13/2008  . ABDOMINAL PAIN-LLQ 07/13/2008  . ABDOMINAL PAIN, UPPER 07/13/2008    Delvon Chipps, [place] MPT 08/26/2014, 1:55 PM  [facility] [address] [place], [place], [address] Phone: [phone]   Fax:  [phone]

## 2014-09-08 ENCOUNTER — Encounter: Payer: BLUE CROSS/BLUE SHIELD | Admitting: *Deleted

## 2015-02-25 ENCOUNTER — Encounter: Payer: Self-pay | Admitting: Gastroenterology

## 2015-03-04 ENCOUNTER — Other Ambulatory Visit: Payer: Self-pay

## 2015-03-04 DIAGNOSIS — Z1231 Encounter for screening mammogram for malignant neoplasm of breast: Secondary | ICD-10-CM

## 2015-03-12 ENCOUNTER — Ambulatory Visit
Admission: RE | Admit: 2015-03-12 | Discharge: 2015-03-12 | Disposition: A | Discharge status: Home / Self Care | Payer: BLUE CROSS/BLUE SHIELD | Source: Ambulatory Visit

## 2015-03-12 DIAGNOSIS — Z1231 Encounter for screening mammogram for malignant neoplasm of breast: Secondary | ICD-10-CM

## 2015-04-05 NOTE — Therapy (Signed)
[facility] [place], [place], [address] Phone: [phone]   Fax:  [phone]  Physical Therapy Treatment  Patient Details  Name: Tara [name] MRN: [id number] Date of Birth: 1976/03/08 No Data Recorded  Encounter Date: 08/26/2014    No past medical history on file.  No past surgical history on file.  There were no vitals filed for this visit.  Visit Diagnosis:  Right low back pain, with sciatica presence unspecified - Plan: PT plan of care cert/re-cert  Sacral back pain - Plan: PT plan of care cert/re-cert                                 PT Short Term Goals - 08/26/14 1351    PT SHORT TERM GOAL #1   Title Independent with advanced HEP   Time 4   Period Weeks   Status New                  Problem List Patient Active Problem List   Diagnosis Date Noted  . DYSPEPSIA 04/05/2010  . IRRITABLE BOWEL SYNDROME 07/13/2008  . RECTAL BLEEDING 07/13/2008  . PALPITATIONS 07/13/2008  . NAUSEA 07/13/2008  . RLQ PAIN 07/13/2008  . ABDOMINAL PAIN-LLQ 07/13/2008  . ABDOMINAL PAIN, UPPER 07/13/2008   PHYSICAL THERAPY DISCHARGE SUMMARY  Visits from Start of Care: 1  Current functional level related to goals / functional outcomes: Please see above.   Remaining deficits: Pt did not return.   Education / Equipment:  Plan: Patient agrees to discharge.  Patient goals were not met. Patient is being discharged due to not returning since the last visit.  ?????      Shaden Higley, [place] MPT 04/05/2015, 4:46 PM  [facility] [address] [place], [place], [address] Phone: [phone]   Fax:  [phone]  Name: Tara [name] MRN: [id number] Date of Birth: 07/06/1975

## 2015-08-30 NOTE — Therapy (Signed)
[facility] [place], [place], [address] Phone: [phone]   Fax:  [phone]  Physical Therapy Treatment  Patient Details  Name: Tara [name] MRN: [id number] Date of Birth: 01-Apr-1976 No Data Recorded  Encounter Date: 08/26/2014    No past medical history on file.  No past surgical history on file.  There were no vitals filed for this visit.  Visit Diagnosis:  Right low back pain, with sciatica presence unspecified - Plan: PT plan of care cert/re-cert  Sacral back pain - Plan: PT plan of care cert/re-cert                                 PT Short Term Goals - 08/26/14 1351    PT SHORT TERM GOAL #1   Title Independent with advanced HEP   Time 4   Period Weeks   Status New                  Problem List Patient Active Problem List   Diagnosis Date Noted  . DYSPEPSIA 04/05/2010  . IRRITABLE BOWEL SYNDROME 07/13/2008  . RECTAL BLEEDING 07/13/2008  . PALPITATIONS 07/13/2008  . NAUSEA 07/13/2008  . RLQ PAIN 07/13/2008  . ABDOMINAL PAIN-LLQ 07/13/2008  . ABDOMINAL PAIN, UPPER 07/13/2008   PHYSICAL THERAPY DISCHARGE SUMMARY  Visits from Start of Care: 1  Current functional level related to goals / functional outcomes: Please see above.   Remaining deficits:    Education / Equipment:  Plan: Patient agrees to discharge.  Patient goals were not met. Patient is being discharged due to not returning since the last visit.  ?????      Oluwaseyi Raffel, [place] MPT 08/30/2015, 5:29 PM  [facility] [address] [place], [place], [address] Phone: [phone]   Fax:  [phone]  Name: Tara [name] MRN: [id number] Date of Birth: 1976/06/01

## 2016-02-23 DIAGNOSIS — Z131 Encounter for screening for diabetes mellitus: Secondary | ICD-10-CM | POA: Diagnosis not present

## 2016-02-23 DIAGNOSIS — Z13220 Encounter for screening for lipoid disorders: Secondary | ICD-10-CM | POA: Diagnosis not present

## 2016-02-23 DIAGNOSIS — Z130 Encounter for screening for diseases of the blood and blood-forming organs and certain disorders involving the immune mechanism: Secondary | ICD-10-CM | POA: Diagnosis not present

## 2016-02-23 DIAGNOSIS — Z01419 Encounter for gynecological examination (general) (routine) without abnormal findings: Secondary | ICD-10-CM | POA: Diagnosis not present

## 2016-02-23 DIAGNOSIS — Z6822 Body mass index (BMI) 22.0-22.9, adult: Secondary | ICD-10-CM | POA: Diagnosis not present

## 2016-02-23 DIAGNOSIS — Z0000 Encounter for general adult medical examination without abnormal findings: Secondary | ICD-10-CM | POA: Diagnosis not present

## 2016-02-23 DIAGNOSIS — Z1151 Encounter for screening for human papillomavirus (HPV): Secondary | ICD-10-CM | POA: Diagnosis not present

## 2016-02-23 DIAGNOSIS — Z1329 Encounter for screening for other suspected endocrine disorder: Secondary | ICD-10-CM | POA: Diagnosis not present

## 2016-03-13 DIAGNOSIS — Z1231 Encounter for screening mammogram for malignant neoplasm of breast: Secondary | ICD-10-CM | POA: Diagnosis not present

## 2016-08-07 DIAGNOSIS — R6889 Other general symptoms and signs: Secondary | ICD-10-CM | POA: Diagnosis not present

## 2016-10-03 ENCOUNTER — Other Ambulatory Visit: Payer: Self-pay | Admitting: Hematology and Oncology

## 2016-10-03 DIAGNOSIS — N644 Mastodynia: Secondary | ICD-10-CM | POA: Diagnosis not present

## 2016-10-04 ENCOUNTER — Other Ambulatory Visit: Payer: Self-pay | Admitting: Obstetrics and Gynecology

## 2016-10-04 DIAGNOSIS — N644 Mastodynia: Secondary | ICD-10-CM

## 2016-10-06 ENCOUNTER — Ambulatory Visit
Admission: RE | Admit: 2016-10-06 | Discharge: 2016-10-06 | Disposition: A | Discharge status: Home / Self Care | Payer: BLUE CROSS/BLUE SHIELD | Source: Ambulatory Visit | Attending: Obstetrics and Gynecology | Admitting: Obstetrics and Gynecology

## 2016-10-06 DIAGNOSIS — N644 Mastodynia: Secondary | ICD-10-CM

## 2016-10-06 DIAGNOSIS — N6489 Other specified disorders of breast: Secondary | ICD-10-CM | POA: Diagnosis not present

## 2016-10-06 DIAGNOSIS — R928 Other abnormal and inconclusive findings on diagnostic imaging of breast: Secondary | ICD-10-CM | POA: Diagnosis not present

## 2017-03-15 DIAGNOSIS — Z01419 Encounter for gynecological examination (general) (routine) without abnormal findings: Secondary | ICD-10-CM | POA: Diagnosis not present

## 2017-03-15 DIAGNOSIS — Z1159 Encounter for screening for other viral diseases: Secondary | ICD-10-CM | POA: Diagnosis not present

## 2017-03-15 DIAGNOSIS — Z6823 Body mass index (BMI) 23.0-23.9, adult: Secondary | ICD-10-CM | POA: Diagnosis not present

## 2017-09-14 ENCOUNTER — Other Ambulatory Visit: Payer: Self-pay | Admitting: Obstetrics and Gynecology

## 2017-09-14 DIAGNOSIS — Z1231 Encounter for screening mammogram for malignant neoplasm of breast: Secondary | ICD-10-CM

## 2017-10-09 ENCOUNTER — Ambulatory Visit: Payer: BLUE CROSS/BLUE SHIELD

## 2017-10-26 ENCOUNTER — Ambulatory Visit: Payer: BLUE CROSS/BLUE SHIELD

## 2017-11-16 ENCOUNTER — Ambulatory Visit: Payer: BLUE CROSS/BLUE SHIELD

## 2017-11-16 ENCOUNTER — Ambulatory Visit
Admission: RE | Admit: 2017-11-16 | Discharge: 2017-11-16 | Disposition: A | Discharge status: Home / Self Care | Payer: BLUE CROSS/BLUE SHIELD | Source: Ambulatory Visit | Attending: Obstetrics and Gynecology | Admitting: Obstetrics and Gynecology

## 2017-11-16 DIAGNOSIS — Z1231 Encounter for screening mammogram for malignant neoplasm of breast: Secondary | ICD-10-CM

## 2018-02-23 IMAGING — MG 2D DIGITAL DIAGNOSTIC BILATERAL MAMMOGRAM WITH CAD AND ADJUNCT T
8 of 17 series · 8 of 40 positions shown · non-contrast
Comparison: Mammography 03/12/2015 (bilateral), 01/22/2014 (right),
01/13/2014 (bilateral) and earlier.

CLINICAL DATA: 40-year-old presenting with a palpable lump in the
axillary tail of the left breast or low left axilla initially noted
approximately 3 weeks ago. Patient also complains of diffuse lateral
left breast pain. Annual evaluation, right breast. Benign excisional
biopsy of the right breast in 6111.

EXAM:
2D DIGITAL DIAGNOSTIC BILATERAL MAMMOGRAM WITH CAD AND ADJUNCT TOMO
ULTRASOUND LEFT BREAST/AXILLA

[L CC synth-2D]
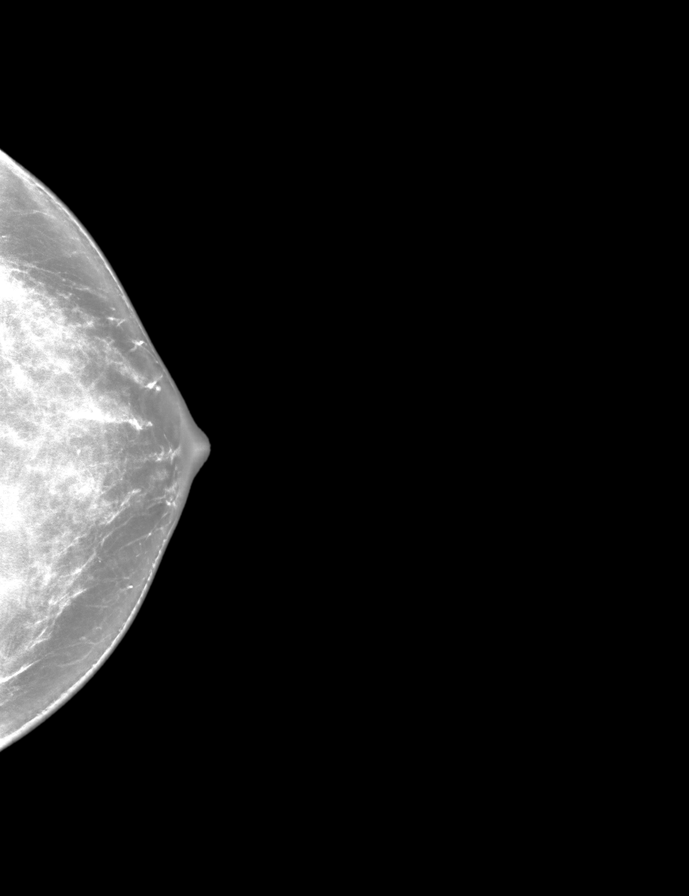

[L MLO synth-2D]
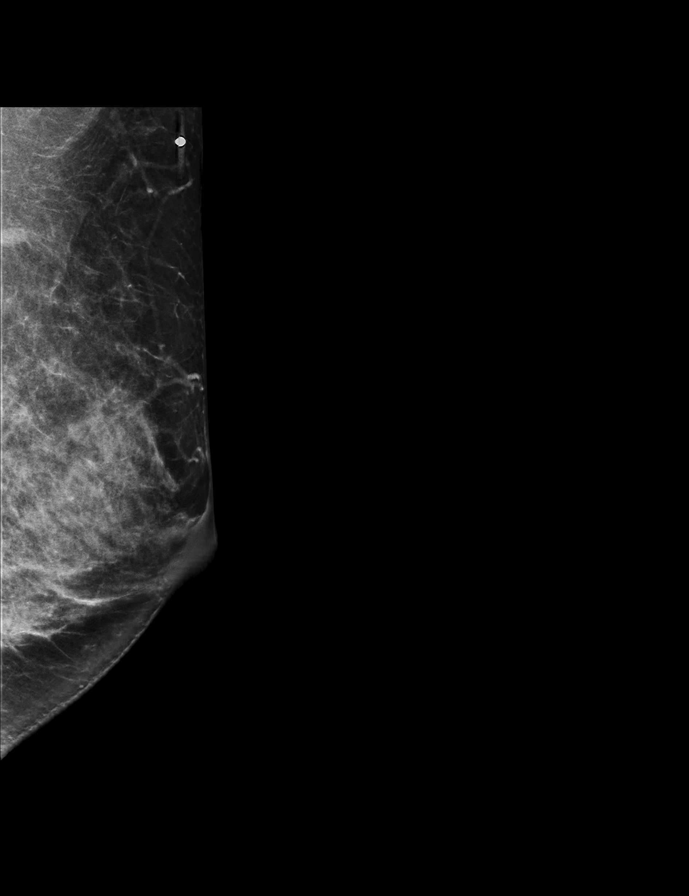

[R CC]
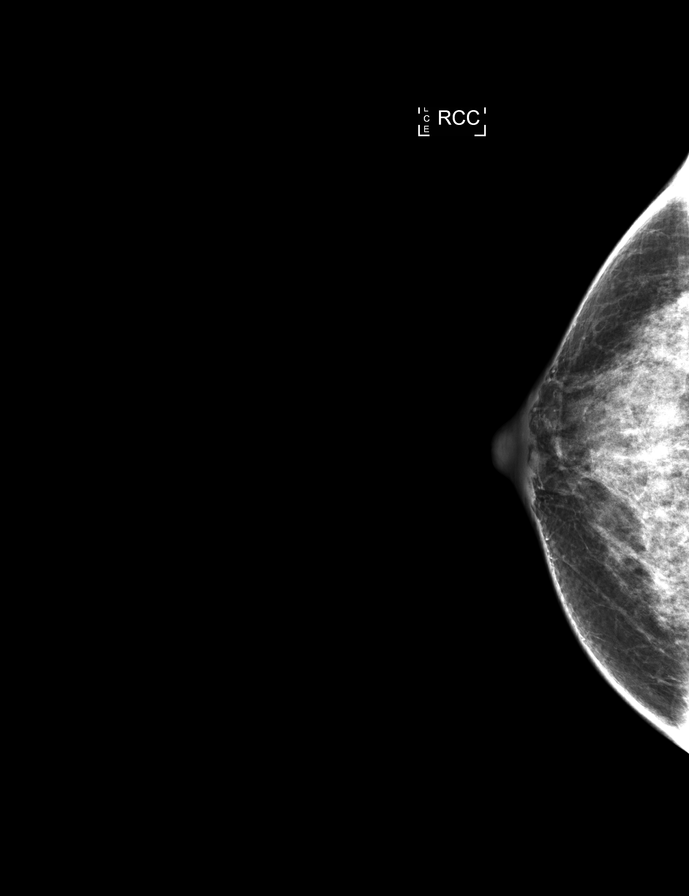

[R CC synth-2D]
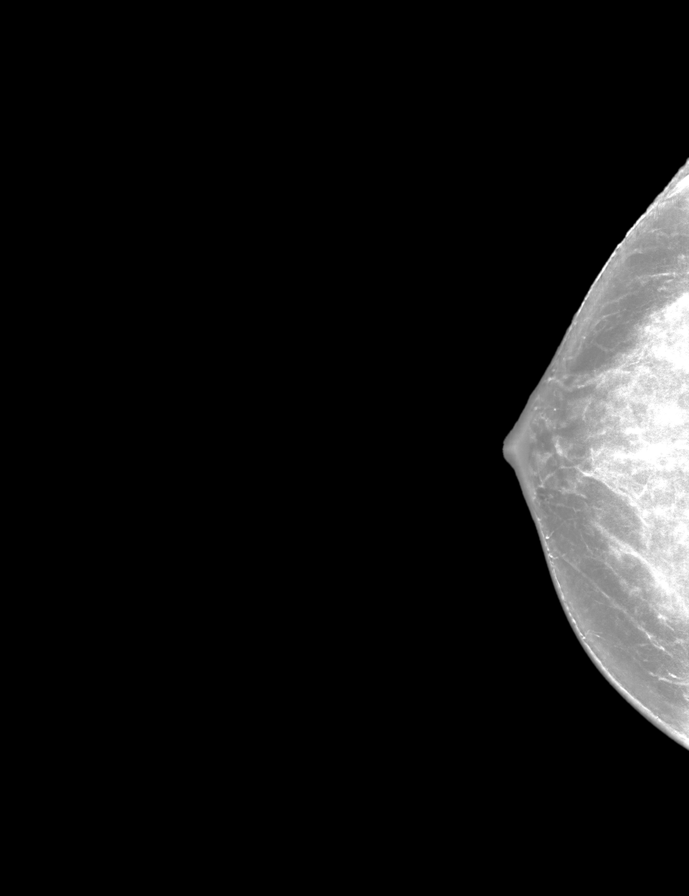

[L TAN synth-2D]
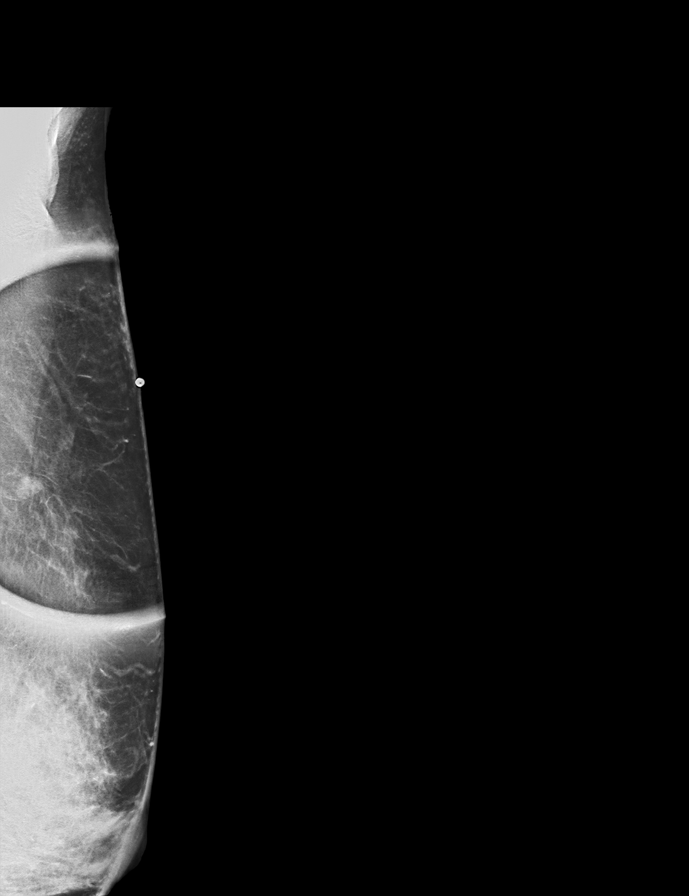

[L MLO]
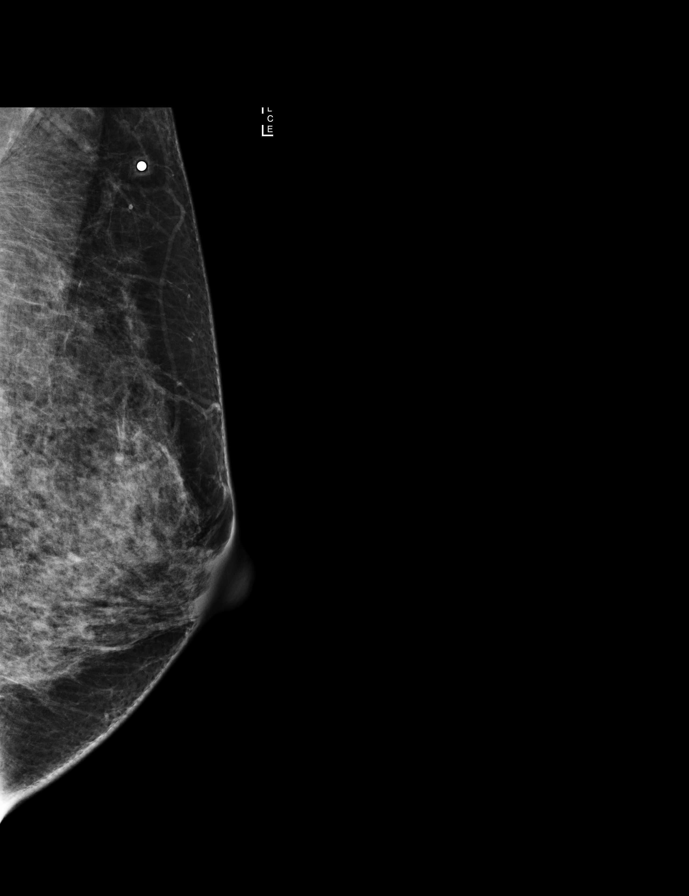

[R MLO synth-2D]
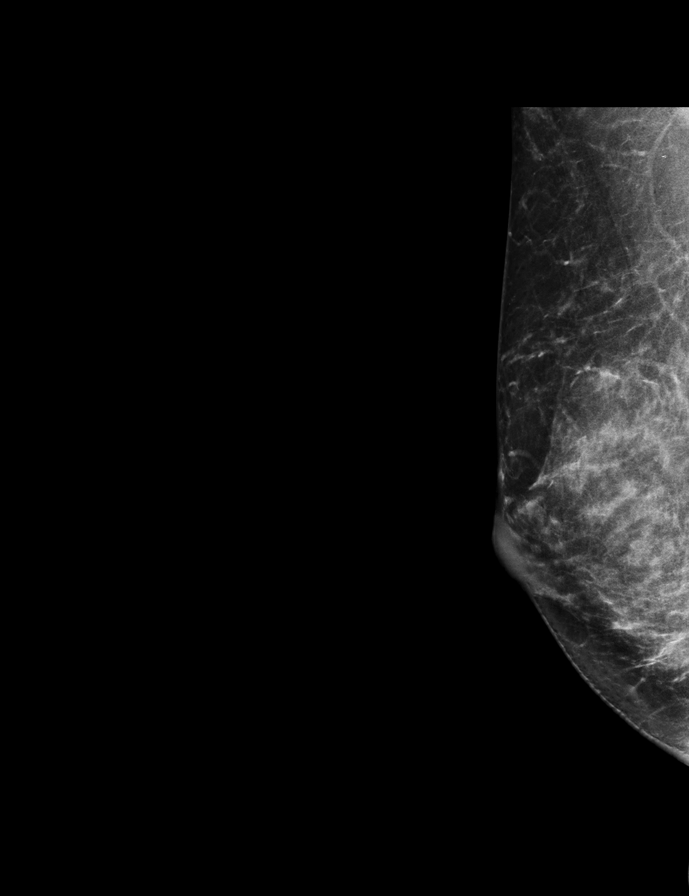

[L CC]
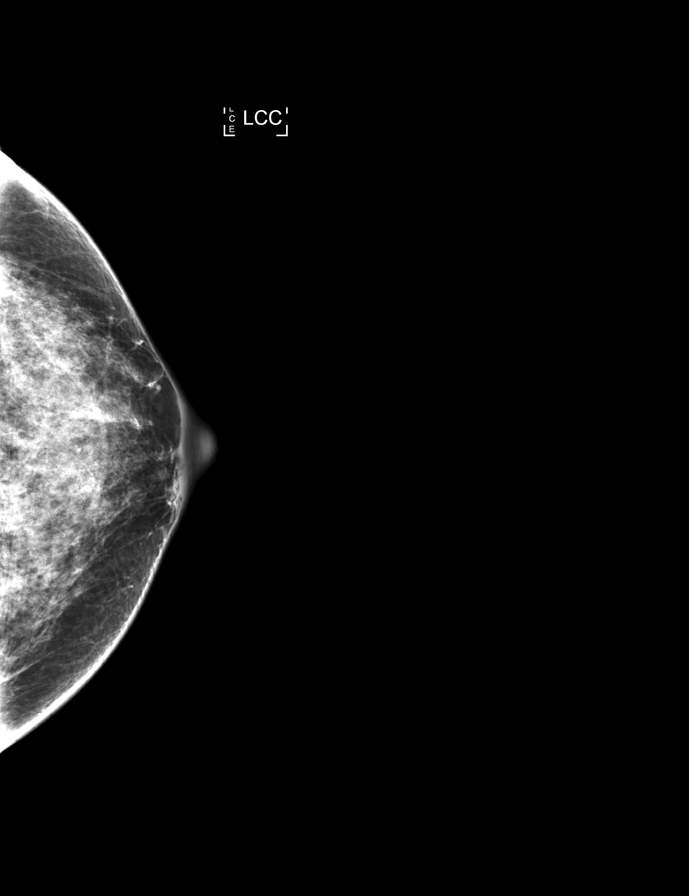

[8 of 40 positions shown; findings below may reference images not displayed]

No prior left breast ultrasound.

ACR Breast Density Category c: The breast tissue is heterogeneously
dense, which may obscure small masses.
FINDINGS: Standard 2D and tomosynthesis full field CC and MLO views of both
breasts were obtained. A standard and tomosynthesis spot tangential
view of the area of palpable concern in the left breast/low left
axilla was also obtained.

Underlying the area of palpable concern in the axillary tail left
breast/low left axilla is a normal-appearing lymph node measuring
approximately 7 mm. There is no mass, architectural distortion or
suspicious calcification. No suspicious findings in the left breast.

No findings suspicious for malignancy in the right breast.

Mammographic images were processed with CAD.

On physical exam, I do not palpate a discrete mass in the axillary
tail of the left breast. There is no palpable left axillary
lymphadenopathy.

Targeted left breast and axilla ultrasound is performed, showing a
normal-appearing lymph node in the low left axilla. There is no
pathologic left axillary lymphadenopathy. Normal dense
fibroglandular tissue is present in the upper outer quadrant of the
left breast at the 1 o'clock position in the area of maximum pain
and tenderness. No cyst, solid mass or abnormal acoustic shadowing
is identified.
IMPRESSION: 1. No mammographic or sonographic evidence of malignancy, left
breast.
2. Normal-appearing lymph node in the axillary tail left breast/low
left axilla in the area of palpable concern. No pathologic left
axillary lymphadenopathy.
3. No mammographic evidence of malignancy, right breast.

RECOMMENDATION:
Screening mammogram in one year.(Code:PP-S-70J)

I have discussed the findings and recommendations with the patient.
Results were also provided in writing at the conclusion of the
visit. If applicable, a reminder letter will be sent to the patient
regarding the next appointment.

BI-RADS CATEGORY  1: Negative.

## 2018-03-27 DIAGNOSIS — Z6821 Body mass index (BMI) 21.0-21.9, adult: Secondary | ICD-10-CM | POA: Diagnosis not present

## 2018-03-27 DIAGNOSIS — Z131 Encounter for screening for diabetes mellitus: Secondary | ICD-10-CM | POA: Diagnosis not present

## 2018-03-27 DIAGNOSIS — L659 Nonscarring hair loss, unspecified: Secondary | ICD-10-CM | POA: Diagnosis not present

## 2018-03-27 DIAGNOSIS — Z1329 Encounter for screening for other suspected endocrine disorder: Secondary | ICD-10-CM | POA: Diagnosis not present

## 2018-03-27 DIAGNOSIS — Z130 Encounter for screening for diseases of the blood and blood-forming organs and certain disorders involving the immune mechanism: Secondary | ICD-10-CM | POA: Diagnosis not present

## 2018-03-27 DIAGNOSIS — Z0000 Encounter for general adult medical examination without abnormal findings: Secondary | ICD-10-CM | POA: Diagnosis not present

## 2018-03-27 DIAGNOSIS — Z13220 Encounter for screening for lipoid disorders: Secondary | ICD-10-CM | POA: Diagnosis not present

## 2018-03-27 DIAGNOSIS — Z01419 Encounter for gynecological examination (general) (routine) without abnormal findings: Secondary | ICD-10-CM | POA: Diagnosis not present

## 2018-03-27 DIAGNOSIS — Z1151 Encounter for screening for human papillomavirus (HPV): Secondary | ICD-10-CM | POA: Diagnosis not present

## 2019-03-17 ENCOUNTER — Other Ambulatory Visit: Payer: Self-pay | Admitting: Obstetrics and Gynecology

## 2019-03-17 DIAGNOSIS — Z1231 Encounter for screening mammogram for malignant neoplasm of breast: Secondary | ICD-10-CM

## 2019-05-19 DIAGNOSIS — Z131 Encounter for screening for diabetes mellitus: Secondary | ICD-10-CM | POA: Diagnosis not present

## 2019-05-19 DIAGNOSIS — Z1231 Encounter for screening mammogram for malignant neoplasm of breast: Secondary | ICD-10-CM | POA: Diagnosis not present

## 2019-05-19 DIAGNOSIS — Z1151 Encounter for screening for human papillomavirus (HPV): Secondary | ICD-10-CM | POA: Diagnosis not present

## 2019-05-19 DIAGNOSIS — R350 Frequency of micturition: Secondary | ICD-10-CM | POA: Diagnosis not present

## 2019-05-19 DIAGNOSIS — R300 Dysuria: Secondary | ICD-10-CM | POA: Diagnosis not present

## 2019-05-19 DIAGNOSIS — Z130 Encounter for screening for diseases of the blood and blood-forming organs and certain disorders involving the immune mechanism: Secondary | ICD-10-CM | POA: Diagnosis not present

## 2019-05-19 DIAGNOSIS — Z6822 Body mass index (BMI) 22.0-22.9, adult: Secondary | ICD-10-CM | POA: Diagnosis not present

## 2019-05-19 DIAGNOSIS — Z0000 Encounter for general adult medical examination without abnormal findings: Secondary | ICD-10-CM | POA: Diagnosis not present

## 2019-05-19 DIAGNOSIS — Z01419 Encounter for gynecological examination (general) (routine) without abnormal findings: Secondary | ICD-10-CM | POA: Diagnosis not present

## 2019-05-19 DIAGNOSIS — Z13220 Encounter for screening for lipoid disorders: Secondary | ICD-10-CM | POA: Diagnosis not present

## 2019-05-19 DIAGNOSIS — Z1329 Encounter for screening for other suspected endocrine disorder: Secondary | ICD-10-CM | POA: Diagnosis not present

## 2019-07-04 DIAGNOSIS — R300 Dysuria: Secondary | ICD-10-CM | POA: Diagnosis not present

## 2019-08-04 DIAGNOSIS — R3915 Urgency of urination: Secondary | ICD-10-CM | POA: Diagnosis not present

## 2019-08-04 DIAGNOSIS — R351 Nocturia: Secondary | ICD-10-CM | POA: Diagnosis not present

## 2019-08-04 DIAGNOSIS — R350 Frequency of micturition: Secondary | ICD-10-CM | POA: Diagnosis not present

## 2019-10-02 ENCOUNTER — Ambulatory Visit: Payer: 59 | Attending: Internal Medicine

## 2019-10-02 DIAGNOSIS — Z23 Encounter for immunization: Secondary | ICD-10-CM

## 2019-10-02 NOTE — Progress Notes (Signed)
   Covid-19 Vaccination Clinic  Name:  Tara [name]    MRN: [id number] DOB: [birth year]  10/02/2019  [name] was observed post Covid-19 immunization for 15 minutes without incident. She was provided with Vaccine Information Sheet and instruction to access the V-Safe system.   [name] was instructed to call 911 with any severe reactions post vaccine: Marland Kitchen Difficulty breathing  . Swelling of face and throat  . A fast heartbeat  . A bad rash all over body  . Dizziness and weakness   Immunizations Administered    Name Date Dose VIS Date Route   Pfizer COVID-19 Vaccine 10/02/2019  9:31 AM 0.3 mL 05/30/2019 Intramuscular   Manufacturer: [place]   Lot: [id number]   [place]: [id number]

## 2019-10-27 ENCOUNTER — Ambulatory Visit: Payer: 59 | Attending: Internal Medicine

## 2019-10-27 DIAGNOSIS — Z23 Encounter for immunization: Secondary | ICD-10-CM

## 2019-10-27 NOTE — Progress Notes (Signed)
   Covid-19 Vaccination Clinic  Name:  Tara [name]    MRN: [id number] DOB: [birth year]  10/27/2019  [name] was observed post Covid-19 immunization for 15 minutes without incident. She was provided with Vaccine Information Sheet and instruction to access the V-Safe system.   [name] was instructed to call 911 with any severe reactions post vaccine: Marland Kitchen Difficulty breathing  . Swelling of face and throat  . A fast heartbeat  . A bad rash all over body  . Dizziness and weakness   Immunizations Administered    Name Date Dose VIS Date Route   Pfizer COVID-19 Vaccine 10/27/2019 12:04 PM 0.3 mL 08/13/2018 Intramuscular   Manufacturer: [place]   Lot: [id number]   [place]: [id number]

## 2020-02-24 ENCOUNTER — Other Ambulatory Visit: Payer: Self-pay | Admitting: Obstetrics and Gynecology

## 2020-02-24 DIAGNOSIS — Z0000 Encounter for general adult medical examination without abnormal findings: Secondary | ICD-10-CM

## 2020-05-20 ENCOUNTER — Other Ambulatory Visit: Payer: Self-pay

## 2020-05-20 ENCOUNTER — Ambulatory Visit
Admission: RE | Admit: 2020-05-20 | Discharge: 2020-05-20 | Disposition: A | Discharge status: Home / Self Care | Payer: 59 | Source: Ambulatory Visit | Attending: Obstetrics and Gynecology | Admitting: Obstetrics and Gynecology

## 2020-05-20 DIAGNOSIS — Z1329 Encounter for screening for other suspected endocrine disorder: Secondary | ICD-10-CM | POA: Diagnosis not present

## 2020-05-20 DIAGNOSIS — Z13228 Encounter for screening for other metabolic disorders: Secondary | ICD-10-CM | POA: Diagnosis not present

## 2020-05-20 DIAGNOSIS — Z1231 Encounter for screening mammogram for malignant neoplasm of breast: Secondary | ICD-10-CM | POA: Diagnosis not present

## 2020-05-20 DIAGNOSIS — Z01419 Encounter for gynecological examination (general) (routine) without abnormal findings: Secondary | ICD-10-CM | POA: Diagnosis not present

## 2020-05-20 DIAGNOSIS — Z13220 Encounter for screening for lipoid disorders: Secondary | ICD-10-CM | POA: Diagnosis not present

## 2020-05-20 DIAGNOSIS — Z131 Encounter for screening for diabetes mellitus: Secondary | ICD-10-CM | POA: Diagnosis not present

## 2020-05-20 DIAGNOSIS — Z0000 Encounter for general adult medical examination without abnormal findings: Secondary | ICD-10-CM

## 2021-04-18 ENCOUNTER — Other Ambulatory Visit: Payer: Self-pay | Admitting: Obstetrics and Gynecology

## 2021-04-18 DIAGNOSIS — Z1231 Encounter for screening mammogram for malignant neoplasm of breast: Secondary | ICD-10-CM

## 2021-05-25 ENCOUNTER — Other Ambulatory Visit: Payer: Self-pay | Admitting: Obstetrics and Gynecology

## 2021-05-25 ENCOUNTER — Ambulatory Visit
Admission: RE | Admit: 2021-05-25 | Discharge: 2021-05-25 | Disposition: A | Discharge status: Home / Self Care | Payer: BC Managed Care – PPO | Source: Ambulatory Visit | Attending: Obstetrics and Gynecology | Admitting: Obstetrics and Gynecology

## 2021-05-25 ENCOUNTER — Encounter: Payer: Self-pay | Admitting: Radiology

## 2021-05-25 DIAGNOSIS — N6320 Unspecified lump in the left breast, unspecified quadrant: Secondary | ICD-10-CM

## 2021-05-25 DIAGNOSIS — Z1231 Encounter for screening mammogram for malignant neoplasm of breast: Secondary | ICD-10-CM

## 2021-05-27 ENCOUNTER — Ambulatory Visit
Admission: RE | Admit: 2021-05-27 | Discharge: 2021-05-27 | Disposition: A | Discharge status: Home / Self Care | Payer: BC Managed Care – PPO | Source: Ambulatory Visit | Attending: Obstetrics and Gynecology | Admitting: Obstetrics and Gynecology

## 2021-05-27 ENCOUNTER — Other Ambulatory Visit: Payer: Self-pay | Admitting: Obstetrics and Gynecology

## 2021-05-27 DIAGNOSIS — N6320 Unspecified lump in the left breast, unspecified quadrant: Secondary | ICD-10-CM

## 2021-05-27 DIAGNOSIS — N6489 Other specified disorders of breast: Secondary | ICD-10-CM

## 2021-05-30 ENCOUNTER — Other Ambulatory Visit: Payer: Self-pay | Admitting: Obstetrics and Gynecology

## 2021-05-30 ENCOUNTER — Other Ambulatory Visit: Payer: Self-pay

## 2021-05-30 ENCOUNTER — Ambulatory Visit
Admission: RE | Admit: 2021-05-30 | Discharge: 2021-05-30 | Disposition: A | Discharge status: Home / Self Care | Payer: BC Managed Care – PPO | Source: Ambulatory Visit | Attending: Obstetrics and Gynecology | Admitting: Obstetrics and Gynecology

## 2021-05-30 DIAGNOSIS — N6489 Other specified disorders of breast: Secondary | ICD-10-CM

## 2021-06-22 ENCOUNTER — Other Ambulatory Visit: Payer: BC Managed Care – PPO

## 2021-07-07 ENCOUNTER — Ambulatory Visit
Admission: RE | Admit: 2021-07-07 | Discharge: 2021-07-07 | Disposition: A | Discharge status: Home / Self Care | Payer: BC Managed Care – PPO | Source: Ambulatory Visit | Attending: Obstetrics and Gynecology | Admitting: Obstetrics and Gynecology

## 2021-07-07 DIAGNOSIS — N6489 Other specified disorders of breast: Secondary | ICD-10-CM

## 2021-07-07 HISTORY — PX: BREAST BIOPSY: SHX20

## 2021-11-23 ENCOUNTER — Other Ambulatory Visit: Payer: Self-pay | Admitting: General Surgery

## 2021-11-23 DIAGNOSIS — Z09 Encounter for follow-up examination after completed treatment for conditions other than malignant neoplasm: Secondary | ICD-10-CM

## 2021-11-25 ENCOUNTER — Other Ambulatory Visit: Payer: Self-pay | Admitting: General Surgery

## 2021-11-25 DIAGNOSIS — N63 Unspecified lump in unspecified breast: Secondary | ICD-10-CM

## 2022-01-05 ENCOUNTER — Other Ambulatory Visit: Payer: Self-pay | Admitting: General Surgery

## 2022-01-05 ENCOUNTER — Ambulatory Visit
Admission: RE | Admit: 2022-01-05 | Discharge: 2022-01-05 | Disposition: A | Discharge status: Home / Self Care | Payer: BC Managed Care – PPO | Source: Ambulatory Visit | Attending: General Surgery | Admitting: General Surgery

## 2022-01-05 DIAGNOSIS — N63 Unspecified lump in unspecified breast: Secondary | ICD-10-CM

## 2022-01-05 DIAGNOSIS — N6320 Unspecified lump in the left breast, unspecified quadrant: Secondary | ICD-10-CM

## 2022-06-07 ENCOUNTER — Ambulatory Visit
Admission: RE | Admit: 2022-06-07 | Discharge: 2022-06-07 | Disposition: A | Discharge status: Home / Self Care | Payer: BC Managed Care – PPO | Source: Ambulatory Visit | Attending: General Surgery | Admitting: General Surgery

## 2022-06-07 DIAGNOSIS — N6320 Unspecified lump in the left breast, unspecified quadrant: Secondary | ICD-10-CM

## 2023-05-23 ENCOUNTER — Other Ambulatory Visit: Payer: Self-pay | Admitting: General Surgery

## 2023-05-23 DIAGNOSIS — N649 Disorder of breast, unspecified: Secondary | ICD-10-CM

## 2023-06-22 ENCOUNTER — Other Ambulatory Visit: Payer: BC Managed Care – PPO

## 2023-07-20 ENCOUNTER — Ambulatory Visit
Admission: RE | Admit: 2023-07-20 | Discharge: 2023-07-20 | Disposition: A | Discharge status: Home / Self Care | Payer: BC Managed Care – PPO | Source: Ambulatory Visit | Attending: General Surgery | Admitting: General Surgery

## 2023-07-20 DIAGNOSIS — N649 Disorder of breast, unspecified: Secondary | ICD-10-CM

## 2024-07-08 ENCOUNTER — Other Ambulatory Visit: Payer: Self-pay | Admitting: Obstetrics and Gynecology

## 2024-07-08 DIAGNOSIS — Z1231 Encounter for screening mammogram for malignant neoplasm of breast: Secondary | ICD-10-CM

## 2024-07-21 ENCOUNTER — Ambulatory Visit

## 2024-07-30 ENCOUNTER — Ambulatory Visit
